# Patient Record
Sex: Male | Born: 1980 | Race: White | Hispanic: No | Marital: Married | Smoking: Never smoker
Health system: Southern US, Community
[De-identification: ages and names within clinical notes are randomized; demographics above are authoritative.]

---

## 2004-12-18 ENCOUNTER — Emergency Department: Payer: Self-pay | Admitting: Emergency Medicine

## 2011-08-09 ENCOUNTER — Emergency Department: Payer: Self-pay | Admitting: Emergency Medicine

## 2011-08-09 LAB — CBC
HCT: 47.6 % (ref 40.0–52.0)
HGB: 16.2 g/dL (ref 13.0–18.0)
MCV: 90 fL (ref 80–100)
Platelet: 172 10*3/uL (ref 150–440)
RBC: 5.31 10*6/uL (ref 4.40–5.90)
RDW: 13.4 % (ref 11.5–14.5)
WBC: 9.3 10*3/uL (ref 3.8–10.6)

## 2011-08-09 LAB — DRUG SCREEN, URINE
Amphetamines, Ur Screen: NEGATIVE (ref ?–1000)
Barbiturates, Ur Screen: NEGATIVE (ref ?–200)
Benzodiazepine, Ur Scrn: NEGATIVE (ref ?–200)
MDMA (Ecstasy)Ur Screen: NEGATIVE (ref ?–500)
Methadone, Ur Screen: NEGATIVE (ref ?–300)
Opiate, Ur Screen: NEGATIVE (ref ?–300)
Phencyclidine (PCP) Ur S: NEGATIVE (ref ?–25)
Tricyclic, Ur Screen: NEGATIVE (ref ?–1000)

## 2011-08-09 LAB — COMPREHENSIVE METABOLIC PANEL
Albumin: 4.3 g/dL (ref 3.4–5.0)
Alkaline Phosphatase: 97 U/L (ref 50–136)
Anion Gap: 9 (ref 7–16)
Bilirubin,Total: 0.5 mg/dL (ref 0.2–1.0)
Calcium, Total: 9.1 mg/dL (ref 8.5–10.1)
Chloride: 106 mmol/L (ref 98–107)
Creatinine: 0.9 mg/dL (ref 0.60–1.30)
Osmolality: 280 (ref 275–301)
Potassium: 3.5 mmol/L (ref 3.5–5.1)
SGOT(AST): 25 U/L (ref 15–37)
SGPT (ALT): 34 U/L
Sodium: 140 mmol/L (ref 136–145)
Total Protein: 8.4 g/dL — ABNORMAL HIGH (ref 6.4–8.2)

## 2011-08-09 LAB — APTT: Activated PTT: 28.9 secs (ref 23.6–35.9)

## 2011-08-09 LAB — PROTIME-INR: INR: 0.9

## 2011-08-09 LAB — TROPONIN I
Troponin-I: <0.02 ng/mL
Troponin-I: <0.02 ng/mL

## 2011-08-09 LAB — MAGNESIUM: Magnesium: 1.7 mg/dL — ABNORMAL LOW

## 2011-09-06 ENCOUNTER — Ambulatory Visit: Payer: Self-pay | Admitting: Internal Medicine

## 2014-03-18 ENCOUNTER — Ambulatory Visit (INDEPENDENT_AMBULATORY_CARE_PROVIDER_SITE_OTHER): Payer: BC Managed Care – PPO | Admitting: Podiatry

## 2014-03-18 ENCOUNTER — Encounter: Payer: Self-pay | Admitting: Podiatry

## 2014-03-18 ENCOUNTER — Ambulatory Visit (INDEPENDENT_AMBULATORY_CARE_PROVIDER_SITE_OTHER): Payer: BC Managed Care – PPO

## 2014-03-18 VITALS — BP 160/85 | HR 92 | Resp 16 | Ht 64.0 in | Wt 185.0 lb

## 2014-03-18 DIAGNOSIS — M722 Plantar fascial fibromatosis: Secondary | ICD-10-CM

## 2014-03-18 DIAGNOSIS — M7661 Achilles tendinitis, right leg: Secondary | ICD-10-CM

## 2014-03-18 NOTE — Patient Instructions (Signed)
Achilles Tendinitis   with Rehab  Achilles tendinitis is a disorder of the Achilles tendon. The Achilles tendon connects the large calf muscles (Gastrocnemius and Soleus) to the heel bone (calcaneus). This tendon is sometimes called the heel cord. It is important for pushing-off and standing on your toes and is important for walking, running, or jumping. Tendinitis is often caused by overuse and repetitive microtrauma.  SYMPTOMS  · Pain, tenderness, swelling, warmth, and redness may occur over the Achilles tendon even at rest.  · Pain with pushing off, or flexing or extending the ankle.  · Pain that is worsened after or during activity.  CAUSES   · Overuse sometimes seen with rapid increase in exercise programs or in sports requiring running and jumping.  · Poor physical conditioning (strength and flexibility or endurance).  · Running sports, especially training running down hills.  · Inadequate warm-up before practice or play or failure to stretch before participation.  · Injury to the tendon.  PREVENTION   · Warm up and stretch before practice or competition.  · Allow time for adequate rest and recovery between practices and competition.  · Keep up conditioning.  ¨ Keep up ankle and leg flexibility.  ¨ Improve or keep muscle strength and endurance.  ¨ Improve cardiovascular fitness.  · Use proper technique.  · Use proper equipment (shoes, skates).  · To help prevent recurrence, taping, protective strapping, or an adhesive bandage may be recommended for several weeks after healing is complete.  PROGNOSIS   · Recovery may take weeks to several months to heal.  · Longer recovery is expected if symptoms have been prolonged.  · Recovery is usually quicker if the inflammation is due to a direct blow as compared with overuse or sudden strain.  RELATED COMPLICATIONS   · Healing time will be prolonged if the condition is not correctly treated. The injury must be given plenty of time to heal.  · Symptoms can reoccur if  activity is resumed too soon.  · Untreated, tendinitis may increase the risk of tendon rupture requiring additional time for recovery and possibly surgery.  TREATMENT   · The first treatment consists of rest anti-inflammatory medication, and ice to relieve the pain.  · Stretching and strengthening exercises after resolution of pain will likely help reduce the risk of recurrence. Referral to a physical therapist or athletic trainer for further evaluation and treatment may be helpful.  · A walking boot or cast may be recommended to rest the Achilles tendon. This can help break the cycle of inflammation and microtrauma.  · Arch supports (orthotics) may be prescribed or recommended by your caregiver as an adjunct to therapy and rest.  · Surgery to remove the inflamed tendon lining or degenerated tendon tissue is rarely necessary and has shown less than predictable results.  MEDICATION   · Nonsteroidal anti-inflammatory medications, such as aspirin and ibuprofen, may be used for pain and inflammation relief. Do not take within 7 days before surgery. Take these as directed by your caregiver. Contact your caregiver immediately if any bleeding, stomach upset, or signs of allergic reaction occur. Other minor pain relievers, such as acetaminophen, may also be used.  · Pain relievers may be prescribed as necessary by your caregiver. Do not take prescription pain medication for longer than 4 to 7 days. Use only as directed and only as much as you need.  · Cortisone injections are rarely indicated. Cortisone injections may weaken tendons and predispose to rupture. It is better   to give the condition more time to heal than to use them.  HEAT AND COLD  · Cold is used to relieve pain and reduce inflammation for acute and chronic Achilles tendinitis. Cold should be applied for 10 to 15 minutes every 2 to 3 hours for inflammation and pain and immediately after any activity that aggravates your symptoms. Use ice packs or an ice  massage.  · Heat may be used before performing stretching and strengthening activities prescribed by your caregiver. Use a heat pack or a warm soak.  SEEK MEDICAL CARE IF:  · Symptoms get worse or do not improve in 2 weeks despite treatment.  · New, unexplained symptoms develop. Drugs used in treatment may produce side effects.  EXERCISES  RANGE OF MOTION (ROM) AND STRETCHING EXERCISES - Achilles Tendinitis   These exercises may help you when beginning to rehabilitate your injury. Your symptoms may resolve with or without further involvement from your physician, physical therapist or athletic trainer. While completing these exercises, remember:   · Restoring tissue flexibility helps normal motion to return to the joints. This allows healthier, less painful movement and activity.  · An effective stretch should be held for at least 30 seconds.  · A stretch should never be painful. You should only feel a gentle lengthening or release in the stretched tissue.  STRETCH - Gastroc, Standing   · Place hands on wall.  · Extend right / left leg, keeping the front knee somewhat bent.  · Slightly point your toes inward on your back foot.  · Keeping your right / left heel on the floor and your knee straight, shift your weight toward the wall, not allowing your back to arch.  · You should feel a gentle stretch in the right / left calf. Hold this position for __________ seconds.  Repeat __________ times. Complete this stretch __________ times per day.  STRETCH - Soleus, Standing   · Place hands on wall.  · Extend right / left leg, keeping the other knee somewhat bent.  · Slightly point your toes inward on your back foot.  · Keep your right / left heel on the floor, bend your back knee, and slightly shift your weight over the back leg so that you feel a gentle stretch deep in your back calf.  · Hold this position for __________ seconds.  Repeat __________ times. Complete this stretch __________ times per day.  STRETCH -  Gastrocsoleus, Standing   Note: This exercise can place a lot of stress on your foot and ankle. Please complete this exercise only if specifically instructed by your caregiver.   · Place the ball of your right / left foot on a step, keeping your other foot firmly on the same step.  · Hold on to the wall or a rail for balance.  · Slowly lift your other foot, allowing your body weight to press your heel down over the edge of the step.  · You should feel a stretch in your right / left calf.  · Hold this position for __________ seconds.  · Repeat this exercise with a slight bend in your knee.  Repeat __________ times. Complete this stretch __________ times per day.   STRENGTHENING EXERCISES - Achilles Tendinitis  These exercises may help you when beginning to rehabilitate your injury. They may resolve your symptoms with or without further involvement from your physician, physical therapist or athletic trainer. While completing these exercises, remember:   · Muscles can gain both the endurance   and the strength needed for everyday activities through controlled exercises.  · Complete these exercises as instructed by your physician, physical therapist or athletic trainer. Progress the resistance and repetitions only as guided.  · You may experience muscle soreness or fatigue, but the pain or discomfort you are trying to eliminate should never worsen during these exercises. If this pain does worsen, stop and make certain you are following the directions exactly. If the pain is still present after adjustments, discontinue the exercise until you can discuss the trouble with your clinician.  STRENGTH - Plantar-flexors   · Sit with your right / left leg extended. Holding onto both ends of a rubber exercise band/tubing, loop it around the ball of your foot. Keep a slight tension in the band.  · Slowly push your toes away from you, pointing them downward.  · Hold this position for __________ seconds. Return slowly, controlling the  tension in the band/tubing.  Repeat __________ times. Complete this exercise __________ times per day.   STRENGTH - Plantar-flexors   · Stand with your feet shoulder width apart. Steady yourself with a wall or table using as little support as needed.  · Keeping your weight evenly spread over the width of your feet, rise up on your toes.*  · Hold this position for __________ seconds.  Repeat __________ times. Complete this exercise __________ times per day.   *If this is too easy, shift your weight toward your right / left leg until you feel challenged. Ultimately, you may be asked to do this exercise with your right / left foot only.  STRENGTH - Plantar-flexors, Eccentric   Note: This exercise can place a lot of stress on your foot and ankle. Please complete this exercise only if specifically instructed by your caregiver.   · Place the balls of your feet on a step. With your hands, use only enough support from a wall or rail to keep your balance.  · Keep your knees straight and rise up on your toes.  · Slowly shift your weight entirely to your right / left toes and pick up your opposite foot. Gently and with controlled movement, lower your weight through your right / left foot so that your heel drops below the level of the step. You will feel a slight stretch in the back of your calf at the end position.  · Use the healthy leg to help rise up onto the balls of both feet, then lower weight only on the right / left leg again. Build up to 15 repetitions. Then progress to 3 consecutive sets of 15 repetitions.*  · After completing the above exercise, complete the same exercise with a slight knee bend (about 30 degrees). Again, build up to 15 repetitions. Then progress to 3 consecutive sets of 15 repetitions.*  Perform this exercise __________ times per day.   *When you easily complete 3 sets of 15, your physician, physical therapist or athletic trainer may advise you to add resistance by wearing a backpack filled with  additional weight.  STRENGTH - Plantar Flexors, Seated   · Sit on a chair that allows your feet to rest flat on the ground. If necessary, sit at the edge of the chair.  · Keeping your toes firmly on the ground, lift your right / left heel as far as you can without increasing any discomfort in your ankle.  Repeat __________ times. Complete this exercise __________ times a day.  *If instructed by your physician, physical therapist or athletic   trainer, you may add ____________________ of resistance by placing a weighted object on your right / left knee.  Document Released: 11/24/2004 Document Revised: 07/18/2011 Document Reviewed: 08/07/2008  [employer]® Patient Information ©2015 [employer]. This information is not intended to replace advice given to you by your health care provider. Make sure you discuss any questions you have with your health care provider.

## 2014-03-18 NOTE — Progress Notes (Signed)
   Subjective:    Patient ID: Trevor GrahamColin J Holsclaw, male    DOB: 05/09/1980, 33 y.o.   MRN: [id number]  HPI Comments: Mr. Trevor NovakHinshaw, 33 year old male, presents the office they with complaints of right heel pain. He states the pain is predominantly in the bottom of his heel as well as the back of it. He previously has [facility] a physician [facility] and x-rays were obtained and he was given etodolac. He states that since starting the etodolac, the pain has resolved. He denies any current pain in this time. Denies any recent injury or trauma to the area. No other complaints at this time.   Foot Pain      Review of Systems  Constitutional: Positive for unexpected weight change.  All other systems reviewed and are negative.      Objective:   Physical Exam AAO x3, NAD DP/PT pulses palpable bilaterally, CRT less than 3 seconds Protective sensation intact with Simms Weinstein monofilament, vibratory sensation intact, Achilles tendon reflex intact There is no tenderness palpation of the plantar medial tubercle of the calcaneus at the insertion the plantar fascia or along the posterior aspect of the calcaneus or along the Achilles tendon. There is no pain with lateral compression of the calcaneus with vibratory sensation. There is no overlying edema, erythema, increased warmth. Subjectively the patient states that when he had the pain he points to the posterior aspect of the calcaneus at the insertion of Achilles tendon. No areas of tenderness or pain with vibratory sensation over the left foot. MMT 5/5, ROM WNL No open lesions. No calf pain, swelling, warmth, erythema        Assessment & Plan:  33 year old male with likely resolved insertional Achilles tendinitis, possible fasciitis. -X-rays were obtained and reviewed with the patient. -Treatment options discussed including alternatives, risks, complications. -Continue with etodolac as needed -Discussed stretching exercises. -Ice to  the effected area. -Discussed shoe gear modification a possible inserts to help support his foot type to help prevent recurrence. -Discuss other was to help prevent recurrence of these issues. Discussed with him that if he has any increase in symptoms to return to the office. In the meantime call the office with any questions, concerns. Follow-up with PCP for other issues mentioned in the review of systems as this not an acute change.

## 2014-03-19 ENCOUNTER — Encounter: Payer: Self-pay | Admitting: Podiatry

## 2022-11-02 ENCOUNTER — Ambulatory Visit (INDEPENDENT_AMBULATORY_CARE_PROVIDER_SITE_OTHER): Payer: BC Managed Care – PPO | Admitting: Dermatology

## 2022-11-02 VITALS — BP 150/93 | HR 84

## 2022-11-02 DIAGNOSIS — Z79899 Other long term (current) drug therapy: Secondary | ICD-10-CM

## 2022-11-02 DIAGNOSIS — Z7189 Other specified counseling: Secondary | ICD-10-CM

## 2022-11-02 DIAGNOSIS — L2089 Other atopic dermatitis: Secondary | ICD-10-CM

## 2022-11-02 DIAGNOSIS — L720 Epidermal cyst: Secondary | ICD-10-CM | POA: Diagnosis not present

## 2022-11-02 DIAGNOSIS — L729 Follicular cyst of the skin and subcutaneous tissue, unspecified: Secondary | ICD-10-CM

## 2022-11-02 DIAGNOSIS — L209 Atopic dermatitis, unspecified: Secondary | ICD-10-CM

## 2022-11-02 MED ORDER — MOMETASONE FUROATE 0.1 % EX CREA: TOPICAL_CREAM | CUTANEOUS | 0 refills | Status: AC

## 2022-11-02 MED ORDER — DOXYCYCLINE MONOHYDRATE 100 MG PO CAPS: 100.0000 mg | ORAL_CAPSULE | Freq: Two times a day (BID) | ORAL | 0 refills | Status: AC

## 2022-11-02 MED ORDER — MUPIROCIN 2 % EX OINT: 1.0000 | TOPICAL_OINTMENT | Freq: Every day | CUTANEOUS | 0 refills | Status: AC

## 2022-11-02 NOTE — Patient Instructions (Addendum)
Start doxycycline 100 mg twice daily with food for 1 week then once daily for 3 weeks.  Start mupirocin daily with dressing changes.  Doxycycline should be taken with food to prevent nausea. Do not lay down for 30 minutes after taking. Be cautious with sun exposure and use good sun protection while on this medication. Pregnant women should not take this medication.   Start mometasone cream twice daily for up to 5 days a week. Avoid applying to face, groin, and axilla. Use as directed. Long-term use can cause thinning of the skin.  Topical steroids (such as triamcinolone, fluocinolone, fluocinonide, mometasone, clobetasol, halobetasol, betamethasone, hydrocortisone) can cause thinning and lightening of the skin if they are used for too long in the same area. Your physician has selected the right strength medicine for your problem and area affected on the body. Please use your medication only as directed by your physician to prevent side effects.   Due to recent changes in healthcare laws, you may see results of your pathology and/or laboratory studies on MyChart before the doctors have had a chance to review them. We understand that in some cases there may be results that are confusing or concerning to you. Please understand that not all results are received at the same time and often the doctors may need to interpret multiple results in order to provide you with the best plan of care or course of treatment. Therefore, we ask that you please give [place] 2 business days to thoroughly review all your results before contacting the office for clarification. Should we see a critical lab result, you will be contacted sooner.   If You Need Anything After Your Visit  If you have any questions or concerns for your doctor, please call our main line at [phone] and press option 4 to reach your doctor's medical assistant. If no one answers, please leave a voicemail as directed and we will return your call as soon  as possible. Messages left after 4 pm will be answered the following business day.   You may also send [place] a message via MyChart. We typically respond to MyChart messages within 1-2 business days.  For prescription refills, please ask your pharmacy to contact our office. Our fax number is [phone].  If you have an urgent issue when the clinic is closed that cannot wait until the next business day, you can page your doctor at the number below.    Please note that while we do our best to be available for urgent issues outside of office hours, we are not available 24/7.   If you have an urgent issue and are unable to reach [place], you may choose to seek medical care at your doctor's office, retail clinic, urgent care center, or emergency room.  If you have a medical emergency, please immediately call 911 or go to the emergency department.  Pager Numbers  - Dr. Gwen Pounds: [phone]  - Dr. Neale Burly: [phone]  - Dr. Roseanne Reno: [phone]  In the event of inclement weather, please call our main line at [phone] for an update on the status of any delays or closures.  Dermatology Medication Tips: Please keep the boxes that topical medications come in in order to help keep track of the instructions about where and how to use these. Pharmacies typically print the medication instructions only on the boxes and not directly on the medication tubes.   If your medication is too expensive, please contact our office at [phone] option 4 or send [place]  a message through MyChart.   We are unable to tell what your co-pay for medications will be in advance as this is different depending on your insurance coverage. However, we may be able to find a substitute medication at lower cost or fill out paperwork to get insurance to cover a needed medication.   If a prior authorization is required to get your medication covered by your insurance company, please allow [place] 1-2 business days to complete this  process.  Drug prices often vary depending on where the prescription is filled and some pharmacies may offer cheaper prices.  The website www.goodrx.com contains coupons for medications through different pharmacies. The prices here do not account for what the cost may be with help from insurance (it may be cheaper with your insurance), but the website can give you the price if you did not use any insurance.  - You can print the associated coupon and take it with your prescription to the pharmacy.  - You may also stop by our office during regular business hours and pick up a GoodRx coupon card.  - If you need your prescription sent electronically to a different pharmacy, notify our office through [facility] or by phone at [phone] option 4.

## 2022-11-02 NOTE — Progress Notes (Signed)
   New Pt Visit   Subjective  STEPHONE Valdez is a 42 y.o. male who presents for the following: spot at neck, present for a few years. Has had some drainage. Patient had excision in the past for similar spot.   The following portions of the chart were reviewed this encounter and updated as appropriate: medications, allergies, medical history  Review of Systems:  No other skin or systemic complaints except as noted in HPI or Assessment and Plan.  Objective  Well appearing patient in no apparent distress; mood and affect are within normal limits. A focused examination was performed of the following areas: neck Relevant exam findings are noted in the Assessment and Plan.   Assessment & Plan   EPIDERMAL INCLUSION CYST with inflammation and rupture Currently draining Exam: Subcutaneous nodule at left posterior neck  Benign-appearing. Exam most consistent with an epidermal inclusion cyst. Discussed that a cyst is a benign growth that can grow over time and sometimes get irritated or inflamed. Recommend observation if it is not bothersome. Discussed option of surgical excision to remove it if it is growing, symptomatic, or other changes noted. Please call for new or changing lesions so they can be evaluated.  Treatment Plan: Debridement performed Start doxycycline 100 mg twice daily with food for 1 week then once daily for 3 weeks.  Start mupirocin daily with dressing changes.  Doxycycline should be taken with food to prevent nausea. Do not lay down for 30 minutes after taking. Be cautious with sun exposure and use good sun protection while on this medication. Pregnant women should not take this medication.   Location: left posterior neck  ATOPIC DERMATITIS with possible Stasis Dermatitis  Exam: Scaly pink papules coalescing to plaques at right lower leg Chronic and persistent condition with duration or expected duration over one year. Condition is symptomatic/ bothersome to patient. Not  currently at goal.  Atopic dermatitis (eczema) is a chronic, relapsing, pruritic condition that can significantly affect quality of life. It is often associated with allergic rhinitis and/or asthma and can require treatment with topical medications, phototherapy, or in severe cases biologic injectable medication (Dupixent; Adbry) or Oral JAK inhibitors.  Treatment Plan: Start mometasone cream twice daily for up to 5 days a week. Avoid applying to face, groin, and axilla. Use as directed. Long-term use can cause thinning of the skin.  Topical steroids (such as triamcinolone, fluocinolone, fluocinonide, mometasone, clobetasol, halobetasol, betamethasone, hydrocortisone) can cause thinning and lightening of the skin if they are used for too long in the same area. Your physician has selected the right strength medicine for your problem and area affected on the body. Please use your medication only as directed by your physician to prevent side effects.   Recommend gentle skin care.   Return in about 3 months (around 02/02/2023) for cyst follow up.  Trevor Valdez, RMA, am acting as scribe for Trevor Sans, MD .  Documentation: I have reviewed the above documentation for accuracy and completeness, and I agree with the above.  Trevor Sans, MD

## 2022-11-04 ENCOUNTER — Encounter: Payer: Self-pay | Admitting: Dermatology

## 2022-11-07 ENCOUNTER — Ambulatory Visit: Payer: Self-pay | Admitting: Dermatology

## 2022-12-15 ENCOUNTER — Ambulatory Visit: Payer: Self-pay | Admitting: Dermatology

## 2023-02-09 ENCOUNTER — Ambulatory Visit: Payer: BC Managed Care – PPO | Admitting: Dermatology

## 2023-06-01 ENCOUNTER — Ambulatory Visit: Payer: BC Managed Care – PPO | Admitting: Dermatology
